# Patient Record
Sex: Male | Born: 1935 | Race: White | Hispanic: No | Marital: Married
Health system: Southern US, Community
[De-identification: ages and names within clinical notes are randomized; demographics above are authoritative.]

---

## 2002-11-21 ENCOUNTER — Other Ambulatory Visit: Payer: Self-pay

## 2004-02-19 ENCOUNTER — Encounter: Payer: Self-pay | Admitting: Internal Medicine

## 2004-03-04 ENCOUNTER — Encounter: Payer: Self-pay | Admitting: Internal Medicine

## 2004-12-02 ENCOUNTER — Ambulatory Visit: Payer: Self-pay | Admitting: Unknown Physician Specialty

## 2005-04-22 ENCOUNTER — Ambulatory Visit: Payer: Self-pay | Admitting: Psychiatry

## 2008-08-31 ENCOUNTER — Emergency Department: Payer: Self-pay | Admitting: Emergency Medicine

## 2009-09-02 ENCOUNTER — Encounter: Payer: Self-pay | Admitting: Internal Medicine

## 2009-09-04 ENCOUNTER — Encounter: Payer: Self-pay | Admitting: Internal Medicine

## 2009-10-04 ENCOUNTER — Encounter: Payer: Self-pay | Admitting: Internal Medicine

## 2009-12-23 ENCOUNTER — Ambulatory Visit: Payer: Self-pay | Admitting: Unknown Physician Specialty

## 2010-02-10 ENCOUNTER — Ambulatory Visit: Payer: Self-pay | Admitting: Internal Medicine

## 2010-09-01 ENCOUNTER — Encounter: Payer: Self-pay | Admitting: Rheumatology

## 2010-09-05 ENCOUNTER — Encounter: Payer: Self-pay | Admitting: Rheumatology

## 2010-10-05 ENCOUNTER — Encounter: Payer: Self-pay | Admitting: Rheumatology

## 2010-11-05 ENCOUNTER — Encounter: Payer: Self-pay | Admitting: Rheumatology

## 2011-01-04 ENCOUNTER — Encounter: Payer: Self-pay | Admitting: Internal Medicine

## 2011-01-05 ENCOUNTER — Encounter: Payer: Self-pay | Admitting: Internal Medicine

## 2011-10-03 ENCOUNTER — Other Ambulatory Visit: Payer: Self-pay | Admitting: Internal Medicine

## 2011-10-03 LAB — BASIC METABOLIC PANEL
BUN: 20 mg/dL — ABNORMAL HIGH (ref 7–18)
Co2: 29 mmol/L (ref 21–32)
Creatinine: 1.08 mg/dL (ref 0.60–1.30)
EGFR (African American): >60
EGFR (Non-African Amer.): >60
Osmolality: 293 (ref 275–301)
Potassium: 4.1 mmol/L (ref 3.5–5.1)
Sodium: 145 mmol/L (ref 136–145)

## 2012-01-30 ENCOUNTER — Emergency Department: Payer: Self-pay | Admitting: Internal Medicine

## 2012-01-30 LAB — CBC
HGB: 8.2 g/dL — ABNORMAL LOW (ref 13.0–18.0)
MCH: 23.9 pg — ABNORMAL LOW (ref 26.0–34.0)
MCV: 77 fL — ABNORMAL LOW (ref 80–100)
RBC: 3.44 10*6/uL — ABNORMAL LOW (ref 4.40–5.90)
WBC: 12.8 10*3/uL — ABNORMAL HIGH (ref 3.8–10.6)

## 2012-01-30 LAB — COMPREHENSIVE METABOLIC PANEL
Alkaline Phosphatase: 51 U/L (ref 50–136)
Anion Gap: 6 — ABNORMAL LOW (ref 7–16)
BUN: 42 mg/dL — ABNORMAL HIGH (ref 7–18)
Bilirubin,Total: 0.2 mg/dL (ref 0.2–1.0)
Calcium, Total: 8.4 mg/dL — ABNORMAL LOW (ref 8.5–10.1)
Chloride: 107 mmol/L (ref 98–107)
Co2: 25 mmol/L (ref 21–32)
EGFR (African American): >60
EGFR (Non-African Amer.): >60
Glucose: 151 mg/dL — ABNORMAL HIGH (ref 65–99)
Osmolality: 289 (ref 275–301)
Potassium: 4.6 mmol/L (ref 3.5–5.1)
SGOT(AST): 15 U/L (ref 15–37)
SGPT (ALT): 15 U/L (ref 12–78)
Sodium: 138 mmol/L (ref 136–145)

## 2012-01-30 LAB — URINALYSIS, COMPLETE
Bacteria: NONE SEEN
Bilirubin,UR: NEGATIVE
Glucose,UR: NEGATIVE mg/dL (ref 0–75)
Leukocyte Esterase: NEGATIVE
Ph: 5 (ref 4.5–8.0)
Protein: NEGATIVE
RBC,UR: 1 /HPF (ref 0–5)
Specific Gravity: 1.023 (ref 1.003–1.030)
Squamous Epithelial: <1

## 2012-01-30 LAB — OCCULT BLOOD X 1 CARD TO LAB, STOOL: Occult Blood, Feces: POSITIVE

## 2012-01-30 LAB — WBCS, STOOL

## 2012-02-02 ENCOUNTER — Observation Stay: Payer: Self-pay | Admitting: Student

## 2012-02-02 LAB — BASIC METABOLIC PANEL
Anion Gap: 8 (ref 7–16)
BUN: 14 mg/dL (ref 7–18)
Calcium, Total: 8.5 mg/dL (ref 8.5–10.1)
Chloride: 105 mmol/L (ref 98–107)
EGFR (African American): >60
EGFR (Non-African Amer.): >60
Glucose: 115 mg/dL — ABNORMAL HIGH (ref 65–99)
Osmolality: 279 (ref 275–301)
Potassium: 3.9 mmol/L (ref 3.5–5.1)
Sodium: 139 mmol/L (ref 136–145)

## 2012-02-02 LAB — URINALYSIS, COMPLETE
Bacteria: NONE SEEN
Bilirubin,UR: NEGATIVE
Leukocyte Esterase: NEGATIVE
Nitrite: NEGATIVE
Ph: 5 (ref 4.5–8.0)
Protein: NEGATIVE
RBC,UR: <1 /HPF (ref 0–5)
Squamous Epithelial: <1

## 2012-02-02 LAB — PROTIME-INR
INR: 1
Prothrombin Time: 13.6 secs (ref 11.5–14.7)

## 2012-02-02 LAB — CBC
HCT: 25.2 % — ABNORMAL LOW (ref 40.0–52.0)
MCH: 24.1 pg — ABNORMAL LOW (ref 26.0–34.0)
MCHC: 31.7 g/dL — ABNORMAL LOW (ref 32.0–36.0)
MCV: 76 fL — ABNORMAL LOW (ref 80–100)
RBC: 3.32 10*6/uL — ABNORMAL LOW (ref 4.40–5.90)
WBC: 7.1 10*3/uL (ref 3.8–10.6)

## 2012-02-03 LAB — BASIC METABOLIC PANEL
BUN: 12 mg/dL (ref 7–18)
EGFR (Non-African Amer.): >60
Osmolality: 283 (ref 275–301)
Potassium: 4 mmol/L (ref 3.5–5.1)
Sodium: 142 mmol/L (ref 136–145)

## 2012-02-03 LAB — CBC WITH DIFFERENTIAL/PLATELET
Eosinophil %: 3.1 %
Lymphocyte #: 1.3 10*3/uL (ref 1.0–3.6)
MCH: 24.1 pg — ABNORMAL LOW (ref 26.0–34.0)
MCHC: 31.7 g/dL — ABNORMAL LOW (ref 32.0–36.0)
Monocyte #: 0.6 x10 3/mm (ref 0.2–1.0)
Monocyte %: 10.6 %
Neutrophil %: 63.4 %
Platelet: 364 10*3/uL (ref 150–440)
RDW: 15.4 % — ABNORMAL HIGH (ref 11.5–14.5)

## 2012-02-04 LAB — HEMOGLOBIN A1C: Hemoglobin A1C: <3.5 % — ABNORMAL LOW (ref 4.2–6.3)

## 2012-09-04 DEATH — deceased

## 2013-09-04 IMAGING — CT CT HEAD WITHOUT CONTRAST
3 of 4 series · 17 of 30 positions shown, 19 images · non-contrast
Comparison: none

REASON FOR EXAM: syncope
COMMENTS:

[Series 4: without · axial · non-contrast · 0.44mm/px · z∈[+256,+370]mm · 5 of 35 slices shown]
[im 6/35  brain]
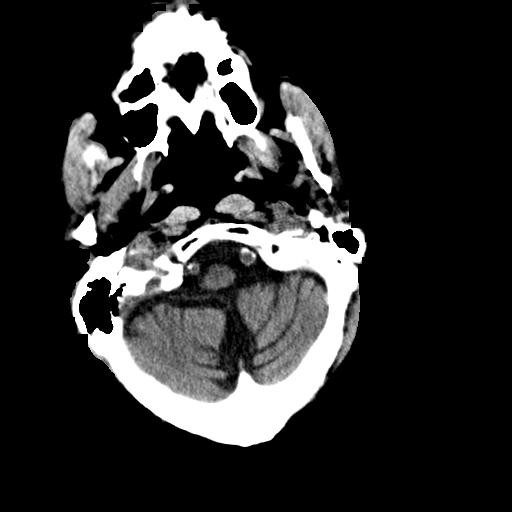
[im 12/35  brain]
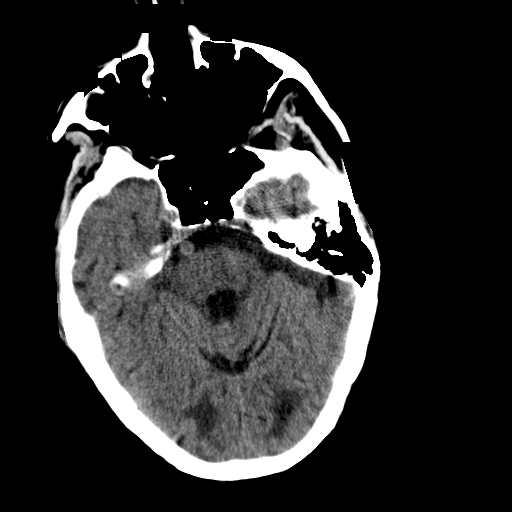
[im 18/35  brain]
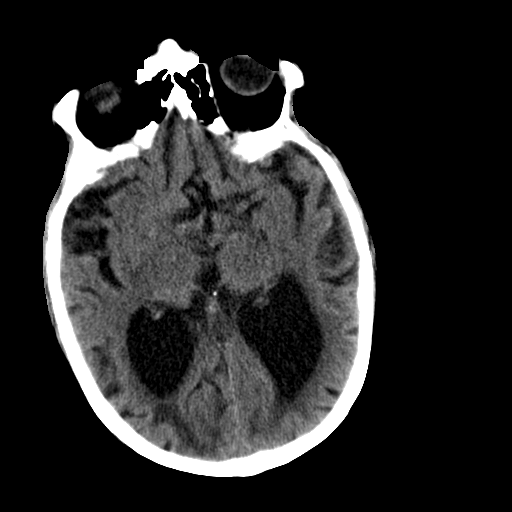
[im 23/35  brain]
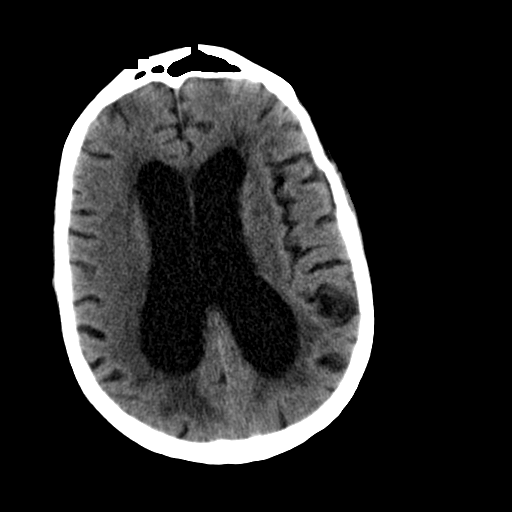
[im 29/35  brain]
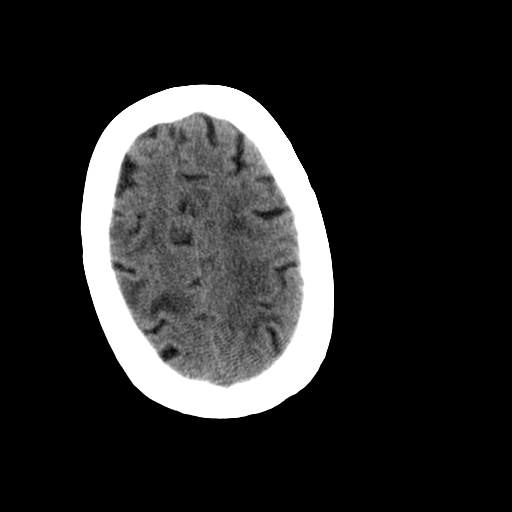

[Series 6: without 2 · axial · non-contrast · 0.44mm/px · z∈[+243,+373]mm · 6 of 38 slices shown, 8 images]
[im 6/38  brain]
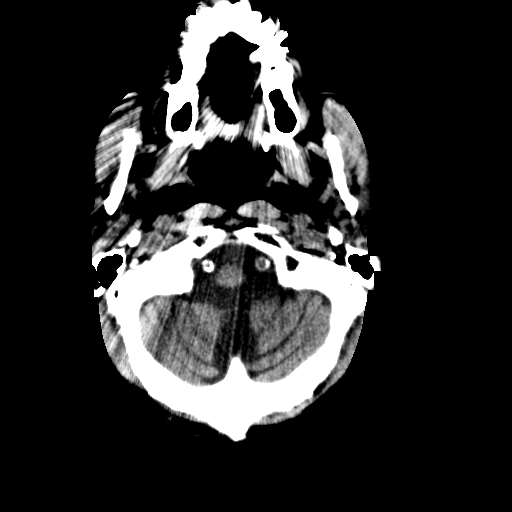
[im 6/38  bone]
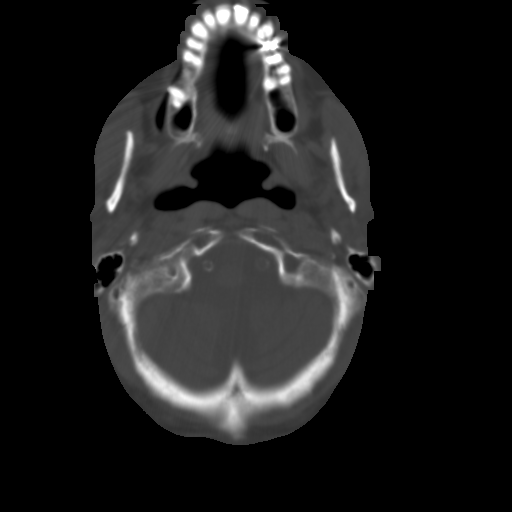
[im 11/38  brain]
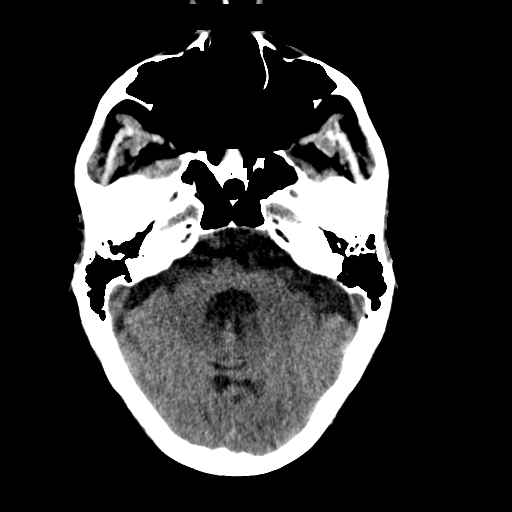
[im 16/38  brain]
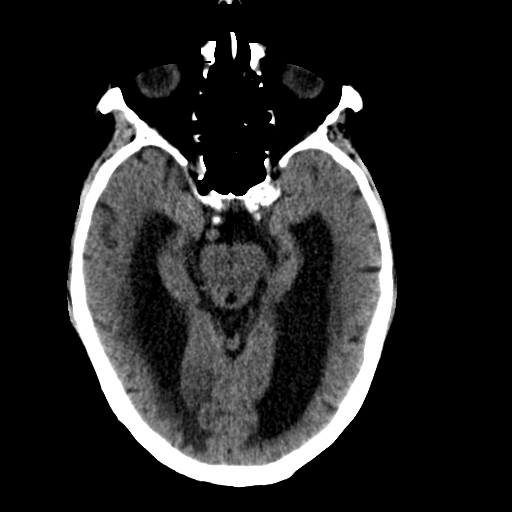
[im 22/38  brain]
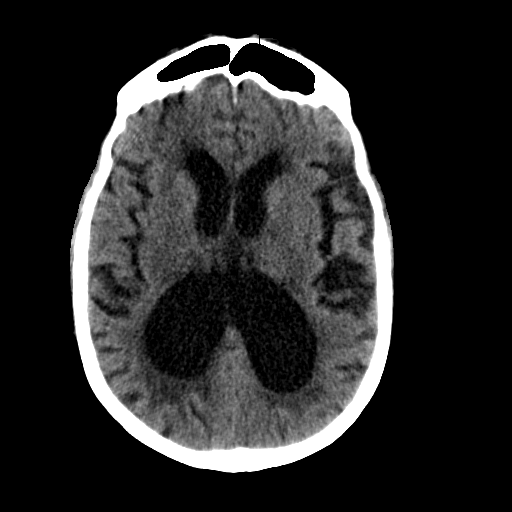
[im 27/38  brain]
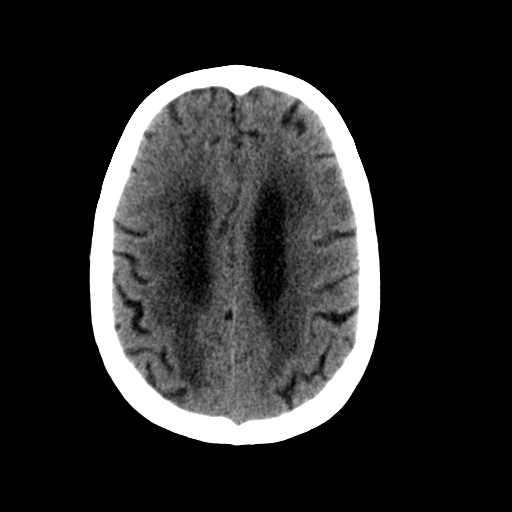
[im 27/38  bone]
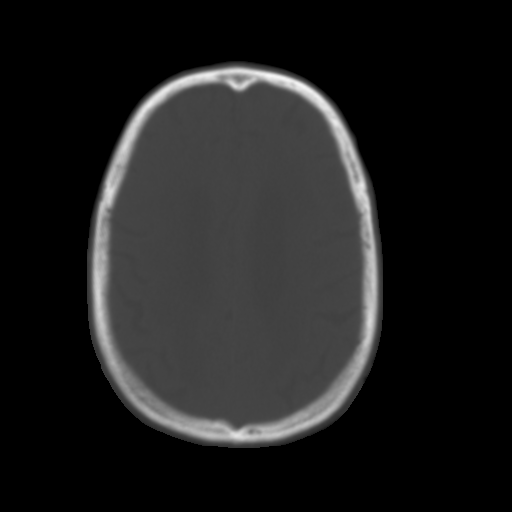
[im 32/38  brain]
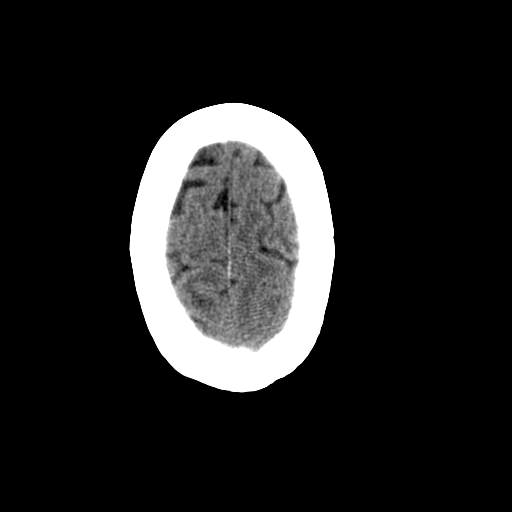

[Series 7: bone 2 · axial · 0.44mm/px · z∈[+234,+373]mm · 6 of 40 slices shown]
[im 6/40  bone]
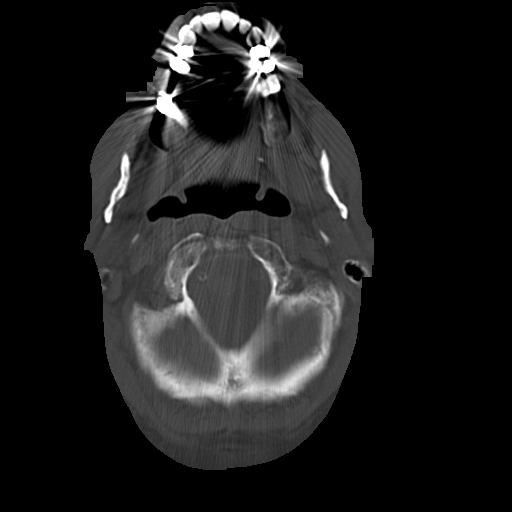
[im 12/40  bone]
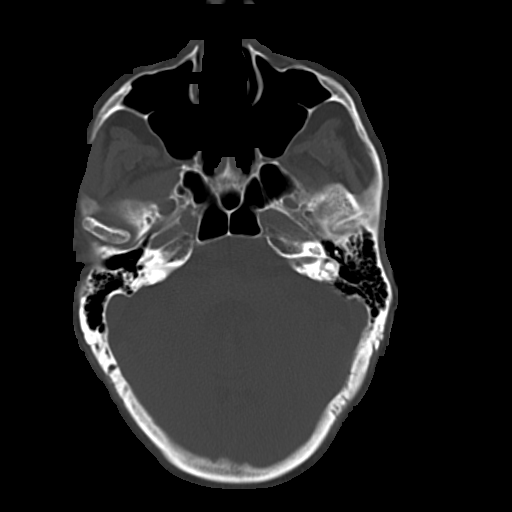
[im 17/40  bone]
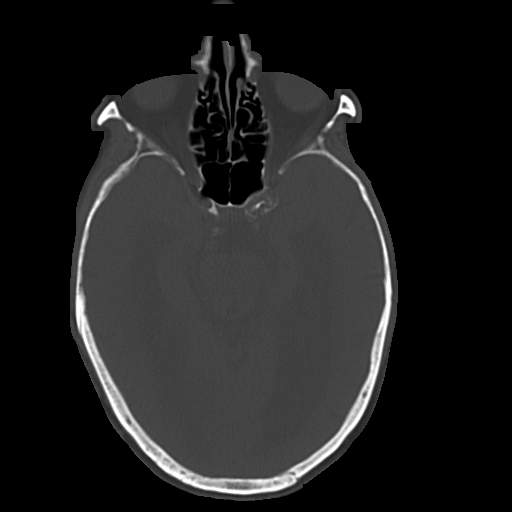
[im 23/40  bone]
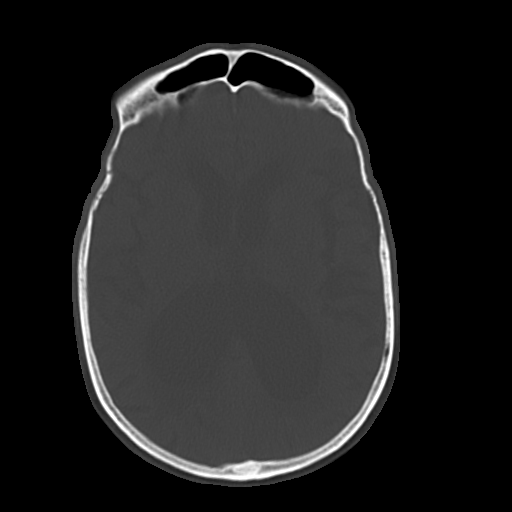
[im 28/40  bone]
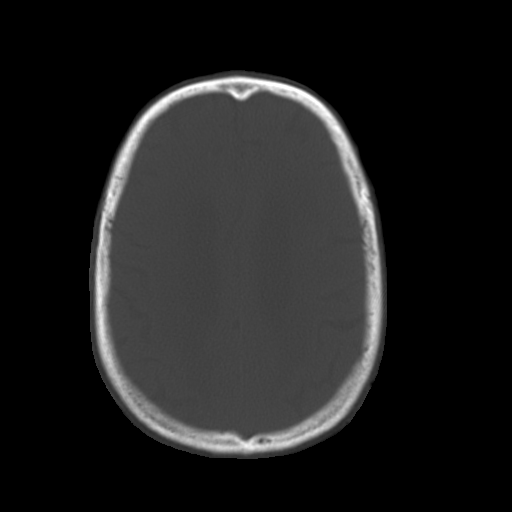
[im 34/40  bone]
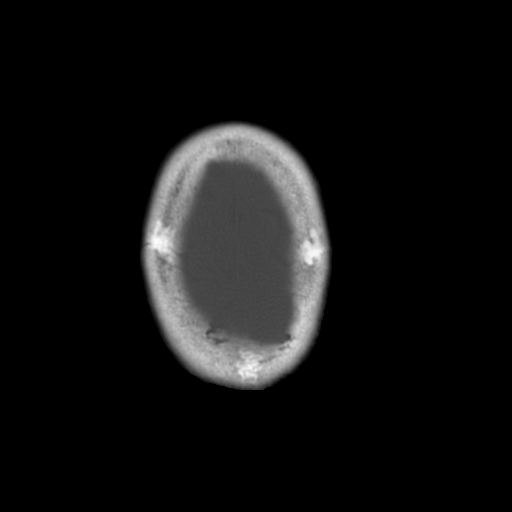

[17 of 30 positions shown; findings below may reference images not displayed]

PROCEDURE:     CT  - CT HEAD WITHOUT CONTRAST  - January 30, 2012 [DATE]

RESULT:     Emergent noncontrast CT of the brain is performed. There is no
previous study for comparison.

There is prominence of the ventricles and sulci. There is diffuse
low-attenuation within the periventricular and subcortical white matter.
There is no definite intracranial hemorrhage, mass or mass effect. A
definite evolving infarct is not appreciated. The included paranasal sinuses
and mastoid air cells appear to be unremarkable. The orbits and calvarium
appear within normal limits.
IMPRESSION: Changes of atrophy with chronic microvascular ischemic
disease. No definite acute intracranial abnormality evident.

[REDACTED]

## 2014-04-26 NOTE — Discharge Summary (Signed)
PATIENT NAME:  Dean Dean [name], Dean Dean [name] MR#:  [id number] DATE OF BIRTH:  1922/08/07  DATE OF ADMISSION:  02/02/2012 DATE OF DISCHARGE:  02/04/2012   CHIEF COMPLAINT:  Anemia.   PRIMARY CARE PHYSICIAN:  Dean Mosherndrew S. Dean LynnLamb, MD   DISCHARGE DIAGNOSES:   1.  Acute on chronic anemia likely from Dean Dean [name].  2.  Dehydration.   3.  Diarrhea which is resolved.  4.  History of benign prostatic hypertrophy.  5.  Restless leg syndrome.  6.  Colon polyps.   DISCHARGE MEDICATIONS:  Gabapentin 300 mg 2 times Dean Dean [name], ropinirole 0.5 mg 1 tab 4 times Dean Dean [name], Protonix 40 mg daily. Please stop taking Plavix.   DIET:  Low sodium, low fat, cholesterol diet.   ACTIVITY:  As tolerated.   The patient will be going home with Dean [name], follow with your primary care physician and check hemoglobin within 1 to 2 weeks.   CODE STATUS:  DO NOT RESUSCITATE.   HISTORY OF PRESENT ILLNESS AND BRIEF HOSPITAL COURSE:  For full details of the H and P, please see the dictation by Dean Dean [name] on January 29th, but briefly, this is an 81 year old gentleman who is being taken care of at home with hospice. He has had diarrhea for the past 5 or 6 days, dehydrated and treated with Imodium as an outpatient, but there was Dean Dean [name] in hemoglobin in the ER. He was found with guaiac stool. The patient of note had been sent by Dr. Randa Dean [name] for Dean [name] blood transfusion for low hemoglobin. On arrival, the patient had Dean [name] with Dean [name]. The patient had albumin of 2.8. Lipase was 213. Hemoglobin A1c was checked and it was within normal limits. He did not have any active GI Dean [name]. Initial WBC was 12.8. Initial hemoglobin was 8.2 which did [name] to 7 on January 31st in the Dean Dean [name]. Stool cultures sent on January 26th were all negative including Dean [name] C. difficile. The patient was given gentle IV fluids. Hemoglobin was cycled and rechecked. The family was notified. The patient was consented for Dean [name] blood transfusion. Given the persistent [name] in  hemoglobin and the fact that the patient's family did not want any aggressive intervention including further workup and Dean Dean [name], the decision was made to transfuse with 1 unit of PRBC and the patient was discharged with hospice care at his home. He does have the help of aides who come to take care of him in addition. The patient's diarrhea has resolved. The dehydration has been resolved as well.   The patient is DO NOT RESUSCITATE.   TOTAL TIME SPENT:  30 minutes.    ____________________________ Dean EatonShayiq Burel Kahre, MD sa:si D: 02/05/2012 13:41:00 ET T: 02/06/2012 17:36:17 ET JOB#: [id number]  cc: Dean EatonShayiq Jalecia Leon, MD, <Dictator> Dean MosherAndrew S. Dean LynnLamb, MD Dean EatonSHAYIQ Arwyn Besaw MD ELECTRONICALLY SIGNED 02/10/2012 20:06

## 2014-04-26 NOTE — H&P (Signed)
PATIENT NAME:  Dean [name], Dean [name] MR#:  [id number] DATE OF BIRTH:  [birth year]  DATE OF ADMISSION:  02/02/2012  PRIMARY CARE PHYSICIAN:  Reola Mosher. Randa Lynn, MD REFERRING PHYSICIAN:  Caleen Jobs. Braud, MD  CHIEF COMPLAINT:  Anemia.   HISTORY OF PRESENT ILLNESS:  The patient is an 81 year old Caucasian male with a history of BPH, restless leg syndrome, colon polyps in 2006. He is in hospice care at home. The patient is noncommunicative lying on the bed in no acute distress. According to the patient's daughters, the patient has had diarrhea for the past 5 to 6 days. He had dehydration and was treated with Imodium and IV fluids in the ED and sent home 2 days ago. At that time, the patient's hemoglobin was 8.2 and today he went to his PCPs office and was noted to have decreased hemoglobin at 7 so Dr. Randa Lynn sent the patient to the ED for blood transfusion due to low hemoglobin at 7. In the ED, Dr. Brien Mates did a stool occult test which was positive. According to the patient's daughters, the patient recently had melena, but no bloody stool. The patient is taking Plavix for compression spine.   PAST MEDICAL HISTORY:  BPH, restless leg syndrome, colon polyps.   PAST SURGICAL HISTORY:  Prostate surgery, possible cholecystectomy.   SOCIAL HISTORY:  No smoking, drinking or illicit drugs. Living at home with hospice care.   FAMILY HISTORY:  Unknown.   REVIEW OF SYSTEMS:  Unable to obtain due to the patient's status.   ALLERGIES:  EPHEDRINE.   HOME MEDICATIONS:  Ropinirone 0.5 mg p.o. 4 times a day, Plavix 75 mg p.o. daily, gabapentin 300 mg p.o. b.i.d.   PHYSICAL EXAMINATION: VITAL SIGNS: Temperature 98.4, blood pressure 126/77, pulse 73, respirations 18, oxygen saturation 100% on room air.  GENERAL: The patient is a weak, but noncommunicative in no acute distress.  HEENT: Pupils round, equal, reactive to light, accommodation. Dry oral mucosa. Clear oropharynx.  NECK: Supple. No JVD or carotid bruits. No  lymphadenopathy. No thyromegaly.  CARDIOVASCULAR: S1, S2 regular rate, rhythm. No murmurs, rubs or gallops.  PULMONARY: Bilateral air entry. No wheezing or rales. No use of accessory muscles to breathe.  ABDOMEN: Soft. No distention or tenderness. No organomegaly. Bowel sounds present.  EXTREMITIES: No edema, clubbing or cyanosis. No calf tenderness. Strong bilateral pedal pulses.  SKIN: No rash or jaundice.  NEUROLOGY: The patient is noncommunicative in no acute distress and cooperative. I was unable to get neuro exam at this time.   LABORATORY DATA:  Glucose 115, BUN 14, creatinine 1.02. Electrolytes are normal. WBC 7.1, hemoglobin 8.0, MCV 76, platelets 387. Stool occult is positive.   IMPRESSION:   1.  Anemia.  2.  Gastrointestinal bleeding with positive stool occult.  3.  Dehydration.  4.  History of benign prostatic hypertrophy.  5.  Restless leg syndrome.   PLAN OF TREATMENT:   1.  The patient will be placed for observation. We will hold Plavix. The patient will not get a blood transfusion at this time due to the hemoglobin is at 8.0. We will follow his CBC. The patient may need PRBC transfusion if his hemoglobin drops to 7.  2.  For dehydration, we will give IV fluid support and follow the BMP.  3.  The patient's diarrhea has been improving. The patient's daughters said he had no diarrhea today. We will monitor it. The patient's daughters do not want aggressive treatment or workup. The patient is possibly going to  be discharged to home with hospice care tomorrow if the hemoglobin is stable.   I discussed patient's situation and plan of treatment with the patient and both daughters.   TIME SPENT:  About 56 minutes.   CODE STATUS:  DO NOT RESUSCITATE.    ____________________________ Shaune PollackQing Lousie Calico, MD qc:si D: 02/02/2012 16:56:00 ET T: 02/02/2012 17:45:29 ET JOB#: [id number]  cc: Shaune PollackQing Cerise Lieber, MD, <Dictator> Shaune PollackQING Gaylen Pereira MD ELECTRONICALLY SIGNED 02/02/2012 18:26
